# Patient Record
Sex: Male | Born: 1970 | Race: White | Hispanic: No | Marital: Single | Smoking: Never smoker
Health system: Southern US, Community
[De-identification: ages and names within clinical notes are randomized; demographics above are authoritative.]

## PROBLEM LIST (undated history)

## (undated) DIAGNOSIS — F32A Depression, unspecified: Secondary | ICD-10-CM

## (undated) DIAGNOSIS — I10 Essential (primary) hypertension: Secondary | ICD-10-CM

## (undated) DIAGNOSIS — F329 Major depressive disorder, single episode, unspecified: Secondary | ICD-10-CM

## (undated) HISTORY — PX: KNEE SURGERY: SHX244

## (undated) HISTORY — DX: Depression, unspecified: F32.A

## (undated) HISTORY — DX: Major depressive disorder, single episode, unspecified: F32.9

## (undated) HISTORY — DX: Essential (primary) hypertension: I10

## (undated) HISTORY — PX: CHOLECYSTECTOMY: SHX55

---

## 2007-01-28 ENCOUNTER — Encounter: Admission: RE | Admit: 2007-01-28 | Discharge: 2007-01-28 | Payer: Self-pay | Admitting: Internal Medicine

## 2012-03-17 ENCOUNTER — Ambulatory Visit (INDEPENDENT_AMBULATORY_CARE_PROVIDER_SITE_OTHER): Payer: BC Managed Care – PPO | Admitting: Emergency Medicine

## 2012-03-17 VITALS — BP 162/120 | HR 87 | Temp 98.4°F | Resp 16 | Ht 69.0 in | Wt 258.0 lb

## 2012-03-17 DIAGNOSIS — J209 Acute bronchitis, unspecified: Secondary | ICD-10-CM

## 2012-03-17 DIAGNOSIS — J018 Other acute sinusitis: Secondary | ICD-10-CM

## 2012-03-17 DIAGNOSIS — H1030 Unspecified acute conjunctivitis, unspecified eye: Secondary | ICD-10-CM

## 2012-03-17 MED ORDER — SULFACETAMIDE SODIUM 10 % OP SOLN: 1.0000 [drp] | OPHTHALMIC | Status: DC

## 2012-03-17 MED ORDER — HYDROCOD POLST-CHLORPHEN POLST 10-8 MG/5ML PO LQCR: 5.0000 mL | Freq: Two times a day (BID) | ORAL | Status: DC | PRN

## 2012-03-17 MED ORDER — PSEUDOEPHEDRINE-GUAIFENESIN ER 60-600 MG PO TB12: 1.0000 | ORAL_TABLET | Freq: Two times a day (BID) | ORAL | Status: AC

## 2012-03-17 MED ORDER — AMOXICILLIN-POT CLAVULANATE 875-125 MG PO TABS: 1.0000 | ORAL_TABLET | Freq: Two times a day (BID) | ORAL | Status: DC

## 2012-03-17 NOTE — Progress Notes (Signed)
[facility] [address], [place] [address] [phone]  Date:  03/17/2012   Name:  Joshua Friedman   DOB:  06/27/1970   MRN:  [id number]  PCP:  No primary provider on file.    Chief Complaint: URI   History of Present Illness:  Joshua Friedman is a 41 y.o. very pleasant male patient who presents with the following:  Developed green-brown post nasal drainage Sunday and cough productive purulent sputum.  Worse in AM.  Had a fever but now resolved. No wheezing or shortness of breath. No nausea or vomiting.  Has a headache and malaise and myalgias.  No improvement with OTC meds.  Has a headache in frontal area.  There is no problem list on file for this patient.   Past Medical History  Diagnosis Date  . Depression   . Hypertension     Past Surgical History  Procedure Date  . Cholecystectomy   . Knee surgery     History  Substance Use Topics  . Smoking status: Never Smoker   . Smokeless tobacco: Not on file  . Alcohol Use: No    Family History  Problem Relation Age of Onset  . Breast cancer Mother   . Hypertension Father   . Prostate cancer Father     No Known Allergies  Medication list has been reviewed and updated.  No current outpatient prescriptions on file prior to visit.    Review of Systems:  As per HPI, otherwise negative.   Physical Examination: Filed Vitals:   03/17/12 1650  BP: 162/120  Pulse:   Temp:   Resp:    Filed Vitals:   03/17/12 1649  Height: 5\' 9"  (1.753 m)  Weight: 258 lb (117.028 kg)   Body mass index is 38.10 kg/(m^2). Ideal Body Weight: Weight in (lb) to have BMI = 25: 168.9    GEN: WDWN, NAD, Non-toxic, A & O x 3  No rash or sepsis or shortness of breath. HEENT: Atraumatic, Normocephalic. Neck supple. No masses, No LAD.  Oropharynx negative.  Bilateral conjunctival injection and purulent drainage worse on right. Ears and Nose: No external deformity.  Purulent nasal and postnasal drainage CV:  RRR, No M/G/R. No JVD. No thrill. No extra heart sounds. PULM: CTA B, no wheezes, crackles, rhonchi. No retractions. No resp. distress. No accessory muscle use. ABD: S, NT, ND, +BS. No rebound. No HSM. EXTR: No c/c/e NEURO Normal gait.  PSYCH: Normally interactive. Conversant. Not depressed or anxious appearing.  Calm demeanor.    Assessment and Plan: Sinusitis Bronchitis Acute purulent conjunctivitis augmentin mucinex d tussionex Sulfacetamide Follow up as needed  Carmelina Dane, MD

## 2012-03-21 NOTE — Progress Notes (Deleted)
  Subjective:    Patient ID: Joshua Friedman, male    DOB: [birth year], 41 y.o.   MRN: [id number]  HPI    Review of Systems     Objective:   Physical Exam        Assessment & Plan:

## 2012-03-21 NOTE — Progress Notes (Signed)
Reviewed and agree.

## 2015-01-30 ENCOUNTER — Ambulatory Visit: Payer: Self-pay | Admitting: Physician Assistant

## 2015-02-04 ENCOUNTER — Ambulatory Visit (INDEPENDENT_AMBULATORY_CARE_PROVIDER_SITE_OTHER): Payer: BLUE CROSS/BLUE SHIELD | Admitting: Urgent Care

## 2015-02-04 ENCOUNTER — Encounter: Payer: Self-pay | Admitting: Urgent Care

## 2015-02-04 VITALS — BP 196/125 | HR 86 | Temp 98.2°F | Resp 16 | Ht 69.6 in | Wt 298.6 lb

## 2015-02-04 DIAGNOSIS — R030 Elevated blood-pressure reading, without diagnosis of hypertension: Secondary | ICD-10-CM | POA: Diagnosis not present

## 2015-02-04 DIAGNOSIS — IMO0001 Reserved for inherently not codable concepts without codable children: Secondary | ICD-10-CM

## 2015-02-04 DIAGNOSIS — I160 Hypertensive urgency: Secondary | ICD-10-CM

## 2015-02-04 DIAGNOSIS — I10 Essential (primary) hypertension: Secondary | ICD-10-CM

## 2015-02-04 DIAGNOSIS — Z833 Family history of diabetes mellitus: Secondary | ICD-10-CM | POA: Diagnosis not present

## 2015-02-04 DIAGNOSIS — Z6841 Body Mass Index (BMI) 40.0 and over, adult: Secondary | ICD-10-CM | POA: Diagnosis not present

## 2015-02-04 LAB — COMPREHENSIVE METABOLIC PANEL
ALT: 32 U/L (ref 9–46)
AST: 19 U/L (ref 10–40)
Albumin: 4.3 g/dL (ref 3.6–5.1)
Alkaline Phosphatase: 77 U/L (ref 40–115)
BUN: 12 mg/dL (ref 7–25)
CO2: 26 mmol/L (ref 20–31)
Calcium: 9.3 mg/dL (ref 8.6–10.3)
Chloride: 102 mmol/L (ref 98–110)
Creat: 0.74 mg/dL (ref 0.60–1.35)
Glucose, Bld: 80 mg/dL (ref 65–99)
Potassium: 3.8 mmol/L (ref 3.5–5.3)
Sodium: 139 mmol/L (ref 135–146)
Total Bilirubin: 0.6 mg/dL (ref 0.2–1.2)
Total Protein: 7.5 g/dL (ref 6.1–8.1)

## 2015-02-04 LAB — CBC
HCT: 46.4 % (ref 39.0–52.0)
Hemoglobin: 15.8 g/dL (ref 13.0–17.0)
MCH: 30.1 pg (ref 26.0–34.0)
MCHC: 34.1 g/dL (ref 30.0–36.0)
MCV: 88.4 fL (ref 78.0–100.0)
MPV: 9.2 fL (ref 8.6–12.4)
Platelets: 328 10*3/uL (ref 150–400)
RBC: 5.25 MIL/uL (ref 4.22–5.81)
RDW: 14 % (ref 11.5–15.5)
WBC: 8.7 10*3/uL (ref 4.0–10.5)

## 2015-02-04 LAB — LIPID PANEL
Cholesterol: 164 mg/dL (ref 125–200)
HDL: 45 mg/dL (ref >=40)
LDL Cholesterol: 88 mg/dL (ref <130)
Total CHOL/HDL Ratio: 3.6 Ratio (ref <=5.0)
Triglycerides: 154 mg/dL — ABNORMAL HIGH (ref <150)
VLDL: 31 mg/dL — ABNORMAL HIGH (ref <30)

## 2015-02-04 LAB — TROPONIN I: Troponin I: <0.01 ng/mL (ref <0.06)

## 2015-02-04 MED ORDER — LISINOPRIL-HYDROCHLOROTHIAZIDE 20-25 MG PO TABS: 1.0000 | ORAL_TABLET | Freq: Every day | ORAL | Status: AC

## 2015-02-04 NOTE — Patient Instructions (Addendum)
Please start taking  aspirin.   Hypertension Hypertension, commonly called high blood pressure, is when the force of blood pumping through your arteries is too strong. Your arteries are the blood vessels that carry blood from your heart throughout your body. A blood pressure reading consists of a higher number over a lower number, such as 110/72. The higher number (systolic) is the pressure inside your arteries when your heart pumps. The lower number (diastolic) is the pressure inside your arteries when your heart relaxes. Ideally you want your blood pressure below 120/80. Hypertension forces your heart to work harder to pump blood. Your arteries may become narrow or stiff. Having untreated or uncontrolled hypertension can cause heart attack, stroke, kidney disease, and other problems. RISK FACTORS Some risk factors for high blood pressure are controllable. Others are not.  Risk factors you cannot control include:   Race. You may be at higher risk if you are African American.  Age. Risk increases with age.  Gender. Men are at higher risk than women before age 41 years. After age 67, women are at higher risk than men. Risk factors you can control include:  Not getting enough exercise or physical activity.  Being overweight.  Getting too much fat, sugar, calories, or salt in your diet.  Drinking too much alcohol. SIGNS AND SYMPTOMS Hypertension does not usually cause signs or symptoms. Extremely high blood pressure (hypertensive crisis) may cause headache, anxiety, shortness of breath, and nosebleed. DIAGNOSIS To check if you have hypertension, your health care provider will measure your blood pressure while you are seated, with your arm held at the level of your heart. It should be measured at least twice using the same arm. Certain conditions can cause a difference in blood pressure between your right and left arms. A blood pressure reading that is higher than normal on one occasion  does not mean that you need treatment. If it is not clear whether you have high blood pressure, you may be asked to return on a different day to have your blood pressure checked again. Or, you may be asked to monitor your blood pressure at home for 1 or more weeks. TREATMENT Treating high blood pressure includes making lifestyle changes and possibly taking medicine. Living a healthy lifestyle can help lower high blood pressure. You may need to change some of your habits. Lifestyle changes may include:  Following the DASH diet. This diet is high in fruits, vegetables, and whole grains. It is low in salt, red meat, and added sugars.  Keep your sodium intake below 2,300 mg per day.  Getting at least 30-45 minutes of aerobic exercise at least 4 times per week.  Losing weight if necessary.  Not smoking.  Limiting alcoholic beverages.  Learning ways to reduce stress. Your health care provider may prescribe medicine if lifestyle changes are not enough to get your blood pressure under control, and if one of the following is true:  You are 85-61 years of age and your systolic blood pressure is above 140.  You are 17 years of age or older, and your systolic blood pressure is above 150.  Your diastolic blood pressure is above 90.  You have diabetes, and your systolic blood pressure is over 140 or your diastolic blood pressure is over 90.  You have kidney disease and your blood pressure is above 140/90.  You have heart disease and your blood pressure is above 140/90. Your personal target blood pressure may vary depending on your medical conditions, your  age, and other factors. HOME CARE INSTRUCTIONS  Have your blood pressure rechecked as directed by your health care provider.   Take medicines only as directed by your health care provider. Follow the directions carefully. Blood pressure medicines must be taken as prescribed. The medicine does not work as well when you skip doses. Skipping  doses also puts you at risk for problems.  Do not smoke.   Monitor your blood pressure at home as directed by your health care provider. SEEK MEDICAL CARE IF:   You think you are having a reaction to medicines taken.  You have recurrent headaches or feel dizzy.  You have swelling in your ankles.  You have trouble with your vision. SEEK IMMEDIATE MEDICAL CARE IF:  You develop a severe headache or confusion.  You have unusual weakness, numbness, or feel faint.  You have severe chest or abdominal pain.  You vomit repeatedly.  You have trouble breathing. MAKE SURE YOU:   Understand these instructions.  Will watch your condition.  Will get help right away if you are not doing well or get worse.   This information is not intended to replace advice given to you by your health care provider. Make sure you discuss any questions you have with your health care provider.   Document Released: 03/30/2005 Document Revised: 08/14/2014 Document Reviewed: 01/20/2013 Elsevier Interactive Patient Education [employer]2016 Elsevier Inc.

## 2015-02-04 NOTE — Progress Notes (Signed)
    MRN: [id number] DOB: 05/23/1970  Subjective:   Sinclair GroomsBrian Heaslip is a 44 y.o. male with pmh of HTN presenting for chief complaint of HTN.  Reports longstanding history of HTN, previously managed with HCT-lis. Patient lost his insurance ~6-7 years ago and he had since stopped taking his BP meds. He does check his BP at home still, has been ranging from 140's-160's systolic and 100-120 diastolic. He has family history positive for diabetes with his father. No history of heart disease. Denies lightheadedness, dizziness, chronic headache, double vision, chest pain, shortness of breath, heart racing, palpitations, nausea, vomiting, abdominal pain, hematuria, lower leg swelling. Denies smoking cigarettes or drinking alcohol. Denies any other aggravating or relieving factors, no other questions or concerns.  Arlys JohnBrian has a current medication list which includes the following prescription(s): multivitamin. Also has No Known Allergies.  Arlys JohnBrian  has a past medical history of Depression and Hypertension. Also  has past surgical history that includes Cholecystectomy and Knee surgery.  Objective:   Vitals: BP 196/125 mmHg  Pulse 86  Temp(Src) 98.2 F (36.8 C) (Oral)  Resp 16  Ht 5' 9.6" (1.768 m)  Wt 298 lb 9.6 oz (135.444 kg)  BMI 43.33 kg/m2  Physical Exam  Constitutional: He is oriented to person, place, and time. He appears well-developed and well-nourished.  HENT:  Mouth/Throat: Oropharynx is clear and moist.  Eyes: Pupils are equal, round, and reactive to light. No scleral icterus.  Neck: Normal range of motion. Neck supple. No thyromegaly present.  Cardiovascular: Normal rate, regular rhythm and intact distal pulses.  Exam reveals no gallop and no friction rub.   No murmur heard. Pulmonary/Chest: No respiratory distress. He has no wheezes. He has no rales.  Abdominal: Soft. Bowel sounds are normal. He exhibits no distension and no mass. There is no tenderness.  Musculoskeletal: He exhibits  no edema.  Neurological: He is alert and oriented to person, place, and time.  Skin: Skin is warm and dry. No rash noted. No erythema. No pallor.  Psychiatric: He has a normal mood and affect.   ECG interpretation by Dr. Patsy Lageropland and PA-Varina Hulon - possible atrial enlargement, no signs of acute process.  Assessment and Plan :   1. Essential hypertension 2. Hypertensive urgency 3. Elevated blood pressure - Patient declined transport to ED. I had a discussion about possibility of organ damage and ACS in the setting of uncontrolled HTN. Patient opted to manage his current status as an outpatient. Physical exam findings and lack of symptoms reassuring but I counseled patient on symptoms of ACS. Patient is to call 911 if these symptoms arise. - He is to recheck at walk-in-clinic tomorrow.  4. Morbid obesity with BMI of 40.0-44.9, adult (HCC) 5. Family history of diabetes mellitus - Recommended significant dietary changes and start exercising - HgB A1c pending   Wallis BambergMario Kailena Lubas, PA-C [facility] [phone] 02/04/2015 4:28 PM

## 2015-02-05 ENCOUNTER — Ambulatory Visit (INDEPENDENT_AMBULATORY_CARE_PROVIDER_SITE_OTHER): Payer: BLUE CROSS/BLUE SHIELD | Admitting: Urgent Care

## 2015-02-05 VITALS — BP 148/80 | HR 86 | Temp 98.1°F | Resp 16 | Ht 69.0 in | Wt 298.0 lb

## 2015-02-05 DIAGNOSIS — Z833 Family history of diabetes mellitus: Secondary | ICD-10-CM

## 2015-02-05 DIAGNOSIS — I10 Essential (primary) hypertension: Secondary | ICD-10-CM

## 2015-02-05 DIAGNOSIS — Z6841 Body Mass Index (BMI) 40.0 and over, adult: Secondary | ICD-10-CM

## 2015-02-05 NOTE — Patient Instructions (Signed)
Hypertension Hypertension, commonly called high blood pressure, is when the force of blood pumping through your arteries is too strong. Your arteries are the blood vessels that carry blood from your heart throughout your body. A blood pressure reading consists of a higher number over a lower number, such as 110/72. The higher number (systolic) is the pressure inside your arteries when your heart pumps. The lower number (diastolic) is the pressure inside your arteries when your heart relaxes. Ideally you want your blood pressure below 120/80. Hypertension forces your heart to work harder to pump blood. Your arteries may become narrow or stiff. Having untreated or uncontrolled hypertension can cause heart attack, stroke, kidney disease, and other problems. RISK FACTORS Some risk factors for high blood pressure are controllable. Others are not.  Risk factors you cannot control include:   Race. You may be at higher risk if you are African American.  Age. Risk increases with age.  Gender. Men are at higher risk than women before age 45 years. After age 65, women are at higher risk than men. Risk factors you can control include:  Not getting enough exercise or physical activity.  Being overweight.  Getting too much fat, sugar, calories, or salt in your diet.  Drinking too much alcohol. SIGNS AND SYMPTOMS Hypertension does not usually cause signs or symptoms. Extremely high blood pressure (hypertensive crisis) may cause headache, anxiety, shortness of breath, and nosebleed. DIAGNOSIS To check if you have hypertension, your health care provider will measure your blood pressure while you are seated, with your arm held at the level of your heart. It should be measured at least twice using the same arm. Certain conditions can cause a difference in blood pressure between your right and left arms. A blood pressure reading that is higher than normal on one occasion does not mean that you need treatment. If  it is not clear whether you have high blood pressure, you may be asked to return on a different day to have your blood pressure checked again. Or, you may be asked to monitor your blood pressure at home for 1 or more weeks. TREATMENT Treating high blood pressure includes making lifestyle changes and possibly taking medicine. Living a healthy lifestyle can help lower high blood pressure. You may need to change some of your habits. Lifestyle changes may include:  Following the DASH diet. This diet is high in fruits, vegetables, and whole grains. It is low in salt, red meat, and added sugars.  Keep your sodium intake below 2,300 mg per day.  Getting at least 30-45 minutes of aerobic exercise at least 4 times per week.  Losing weight if necessary.  Not smoking.  Limiting alcoholic beverages.  Learning ways to reduce stress. Your health care provider may prescribe medicine if lifestyle changes are not enough to get your blood pressure under control, and if one of the following is true:  You are 18-59 years of age and your systolic blood pressure is above 140.  You are 60 years of age or older, and your systolic blood pressure is above 150.  Your diastolic blood pressure is above 90.  You have diabetes, and your systolic blood pressure is over 140 or your diastolic blood pressure is over 90.  You have kidney disease and your blood pressure is above 140/90.  You have heart disease and your blood pressure is above 140/90. Your personal target blood pressure may vary depending on your medical conditions, your age, and other factors. HOME CARE INSTRUCTIONS    Have your blood pressure rechecked as directed by your health care provider.   Take medicines only as directed by your health care provider. Follow the directions carefully. Blood pressure medicines must be taken as prescribed. The medicine does not work as well when you skip doses. Skipping doses also puts you at risk for  problems.  Do not smoke.   Monitor your blood pressure at home as directed by your health care provider. SEEK MEDICAL CARE IF:   You think you are having a reaction to medicines taken.  You have recurrent headaches or feel dizzy.  You have swelling in your ankles.  You have trouble with your vision. SEEK IMMEDIATE MEDICAL CARE IF:  You develop a severe headache or confusion.  You have unusual weakness, numbness, or feel faint.  You have severe chest or abdominal pain.  You vomit repeatedly.  You have trouble breathing. MAKE SURE YOU:   Understand these instructions.  Will watch your condition.  Will get help right away if you are not doing well or get worse.   This information is not intended to replace advice given to you by your health care provider. Make sure you discuss any questions you have with your health care provider.   Document Released: 03/30/2005 Document Revised: 08/14/2014 Document Reviewed: 01/20/2013 Elsevier Interactive Patient Education 2016 Elsevier Inc. Managing Your High Blood Pressure Blood pressure is a measurement of how forceful your blood is pressing against the walls of the arteries. Arteries are muscular tubes within the circulatory system. Blood pressure does not stay the same. Blood pressure rises when you are active, excited, or nervous; and it lowers during sleep and relaxation. If the numbers measuring your blood pressure stay above normal most of the time, you are at risk for health problems. High blood pressure (hypertension) is a long-term (chronic) condition in which blood pressure is elevated. A blood pressure reading is recorded as two numbers, such as 120 over 80 (or 120/80). The first, higher number is called the systolic pressure. It is a measure of the pressure in your arteries as the heart beats. The second, lower number is called the diastolic pressure. It is a measure of the pressure in your arteries as the heart relaxes between  beats.  Keeping your blood pressure in a normal range is important to your overall health and prevention of health problems, such as heart disease and stroke. When your blood pressure is uncontrolled, your heart has to work harder than normal. High blood pressure is a very common condition in adults because blood pressure tends to rise with age. Men and women are equally likely to have hypertension but at different times in life. Before age 45, men are more likely to have hypertension. After 46 years of age, women are more likely to have it. Hypertension is especially common in African Americans. This condition often has no signs or symptoms. The cause of the condition is usually not known. Your caregiver can help you come up with a plan to keep your blood pressure in a normal, healthy range. BLOOD PRESSURE STAGES Blood pressure is classified into four stages: normal, prehypertension, stage 1, and stage 2. Your blood pressure reading will be used to determine what type of treatment, if any, is necessary. Appropriate treatment options are tied to these four stages:  Normal  Systolic pressure (mm Hg): below 120.  Diastolic pressure (mm Hg): below 80. Prehypertension  Systolic pressure (mm Hg): 120 to 139.  Diastolic pressure (mm Hg): 80 to   89. Stage1  Systolic pressure (mm Hg): 140 to 159.  Diastolic pressure (mm Hg): 90 to 99. Stage2  Systolic pressure (mm Hg): 160 or above.  Diastolic pressure (mm Hg): 100 or above. RISKS RELATED TO HIGH BLOOD PRESSURE Managing your blood pressure is an important responsibility. Uncontrolled high blood pressure can lead to:  A heart attack.  A stroke.  A weakened blood vessel (aneurysm).  Heart failure.  Kidney damage.  Eye damage.  Metabolic syndrome.  Memory and concentration problems. HOW TO MANAGE YOUR BLOOD PRESSURE Blood pressure can be managed effectively with lifestyle changes and medicines (if needed). Your caregiver will help  you come up with a plan to bring your blood pressure within a normal range. Your plan should include the following: Education  Read all information provided by your caregivers about how to control blood pressure.  Educate yourself on the latest guidelines and treatment recommendations. New research is always being done to further define the risks and treatments for high blood pressure. Lifestylechanges  Control your weight.  Avoid smoking.  Stay physically active.  Reduce the amount of salt in your diet.  Reduce stress.  Control any chronic conditions, such as high cholesterol or diabetes.  Reduce your alcohol intake. Medicines  Several medicines (antihypertensive medicines) are available, if needed, to bring blood pressure within a normal range. Communication  Review all the medicines you take with your caregiver because there may be side effects or interactions.  Talk with your caregiver about your diet, exercise habits, and other lifestyle factors that may be contributing to high blood pressure.  See your caregiver regularly. Your caregiver can help you create and adjust your plan for managing high blood pressure. RECOMMENDATIONS FOR TREATMENT AND FOLLOW-UP  The following recommendations are based on current guidelines for managing high blood pressure in nonpregnant adults. Use these recommendations to identify the proper follow-up period or treatment option based on your blood pressure reading. You can discuss these options with your caregiver.  Systolic pressure of 120 to 139 or diastolic pressure of 80 to 89: Follow up with your caregiver as directed.  Systolic pressure of 140 to 160 or diastolic pressure of 90 to 100: Follow up with your caregiver within 2 months.  Systolic pressure above 160 or diastolic pressure above 100: Follow up with your caregiver within 1 month.  Systolic pressure above 180 or diastolic pressure above 110: Consider antihypertensive therapy;  follow up with your caregiver within 1 week.  Systolic pressure above 200 or diastolic pressure above 120: Begin antihypertensive therapy; follow up with your caregiver within 1 week.   This information is not intended to replace advice given to you by your health care provider. Make sure you discuss any questions you have with your health care provider.   Document Released: 12/23/2011 Document Reviewed: 12/23/2011 Elsevier Interactive Patient Education 2016 Elsevier Inc.  

## 2015-02-05 NOTE — Progress Notes (Signed)
MRN: [id number] DOB: 06/25/1970  Subjective:   Joshua GroomsBrian Friedman is a 44 y.o. male presenting for chief complaint of Follow-up  Patient was seen yesterday for HTN urgency. He was started on 1/2 tablet of HCT-lis and has taken this twice since yesterday. Admits slight dull headache, has not tried anything for this. Denies lightheadedness, dizziness, severe headache, double vision, chest pain, shortness of breath, heart racing, palpitations, nausea, vomiting, abdominal pain, hematuria, lower leg swelling. Denies any other aggravating or relieving factors, no other questions or concerns.  Joshua Friedman has a current medication list which includes the following prescription(s): lisinopril-hydrochlorothiazide and multivitamin. Also has No Known Allergies.  Joshua Friedman  has a past medical history of Depression and Hypertension. Also  has past surgical history that includes Cholecystectomy and Knee surgery.  Objective:   Vitals: BP 148/80 mmHg  Pulse 86  Temp(Src) 98.1 F (36.7 C) (Oral)  Resp 16  Ht 5\' 9"  (1.753 m)  Wt 298 lb (135.172 kg)  BMI 43.99 kg/m2  SpO2 98%  BP Readings from Last 3 Encounters:  02/05/15 148/80  02/04/15 196/125  03/17/12 162/120   Physical Exam  Constitutional: He is oriented to person, place, and time. He appears well-developed and well-nourished.  HENT:  Mouth/Throat: Oropharynx is clear and moist.  Eyes: Pupils are equal, round, and reactive to light. No scleral icterus.  Cardiovascular: Normal rate, regular rhythm and intact distal pulses.  Exam reveals no gallop and no friction rub.   No murmur heard. No carotid bruits.  Pulmonary/Chest: No respiratory distress. He has no wheezes. He has no rales.  Musculoskeletal: He exhibits no edema.  Neurological: He is alert and oriented to person, place, and time.  Skin: Skin is warm and dry. No rash noted. No erythema. No pallor.    Results for orders placed or performed in visit on 02/04/15 (from the past 24 hour(s))    Comprehensive metabolic panel     Status: None   Collection Time: 02/04/15  4:36 PM  Result Value Ref Range   Sodium 139 135 - 146 mmol/L   Potassium 3.8 3.5 - 5.3 mmol/L   Chloride 102 98 - 110 mmol/L   CO2 26 20 - 31 mmol/L   Glucose, Bld 80 65 - 99 mg/dL   BUN 12 7 - 25 mg/dL   Creat 1.190.74 1.470.60 - 8.291.35 mg/dL   Total Bilirubin 0.6 0.2 - 1.2 mg/dL   Alkaline Phosphatase 77 40 - 115 U/L   AST 19 10 - 40 U/L   ALT 32 9 - 46 U/L   Total Protein 7.5 6.1 - 8.1 g/dL   Albumin 4.3 3.6 - 5.1 g/dL   Calcium 9.3 8.6 - 56.210.3 mg/dL   Narrative   Performed at:  [facility]                [address], [address]                [place], [place] [id number]  Lipid panel     Status: Abnormal   Collection Time: 02/04/15  4:36 PM  Result Value Ref Range   Cholesterol 164 125 - 200 mg/dL   Triglycerides 469154 (H) <150 mg/dL   HDL 45 >=62>=40 mg/dL   Total CHOL/HDL Ratio 3.6 <=5.0 Ratio   VLDL 31 (H) <30 mg/dL   LDL Cholesterol 88 <952<130 mg/dL   Narrative   Performed at:  [facility]  [address], [address]                [place], [place] [address]  CBC     Status: None   Collection Time: 02/04/15  4:36 PM  Result Value Ref Range   WBC 8.7 4.0 - 10.5 K/uL   RBC 5.25 4.22 - 5.81 MIL/uL   Hemoglobin 15.8 13.0 - 17.0 g/dL   HCT 54.0 98.1 - 19.1 %   MCV 88.4 78.0 - 100.0 fL   MCH 30.1 26.0 - 34.0 pg   MCHC 34.1 30.0 - 36.0 g/dL   RDW 47.8 29.5 - 62.1 %   Platelets 328 150 - 400 K/uL   MPV 9.2 8.6 - 12.4 fL   Narrative   Performed at:  [facility]                [address], [address]                [place], [place] [address]  Troponin I     Status: None   Collection Time: 02/04/15  4:48 PM  Result Value Ref Range   Troponin I <0.01 <0.06 ng/mL   Narrative   Performed at:  [facility]                [address], [address]                [place], [place] [address]    Assessment and Plan :   1. Essential hypertension - Significantly  improved, switch to 1 tablet of HCT-Lis daily. Continue aspirin for now. Counseled patient on worrisome symptoms of ACS. Patient is to check BP 2 times weekly. Report BP at 4 week visit unless persistently above 160, start 3rd BP medication.  2. Morbid obesity with BMI of 40.0-44.9, adult (HCC) 3. Family history of diabetes mellitus - Recommended significant dietary modifications and exercise.  Wallis Bamberg, PA-C [facility] [phone] 02/05/2015 12:43 PM

## 2015-02-07 ENCOUNTER — Ambulatory Visit: Payer: Self-pay | Admitting: Urgent Care

## 2015-02-13 ENCOUNTER — Ambulatory Visit: Payer: Self-pay | Admitting: Urgent Care

## 2015-03-05 ENCOUNTER — Ambulatory Visit: Payer: BLUE CROSS/BLUE SHIELD | Admitting: Urgent Care

## 2016-01-04 ENCOUNTER — Other Ambulatory Visit: Payer: Self-pay | Admitting: Family Medicine

## 2016-01-04 DIAGNOSIS — M25561 Pain in right knee: Secondary | ICD-10-CM

## 2016-02-01 ENCOUNTER — Ambulatory Visit
Admission: RE | Admit: 2016-02-01 | Discharge: 2016-02-01 | Disposition: A | Discharge status: Home / Self Care | Payer: 59 | Source: Ambulatory Visit | Attending: Family Medicine | Admitting: Family Medicine

## 2016-02-01 DIAGNOSIS — M25561 Pain in right knee: Secondary | ICD-10-CM

## 2017-02-04 DIAGNOSIS — Z23 Encounter for immunization: Secondary | ICD-10-CM | POA: Diagnosis not present

## 2017-04-20 DIAGNOSIS — E7800 Pure hypercholesterolemia, unspecified: Secondary | ICD-10-CM | POA: Diagnosis not present

## 2017-04-20 DIAGNOSIS — I10 Essential (primary) hypertension: Secondary | ICD-10-CM | POA: Diagnosis not present

## 2017-04-20 DIAGNOSIS — G4733 Obstructive sleep apnea (adult) (pediatric): Secondary | ICD-10-CM | POA: Diagnosis not present

## 2017-04-20 DIAGNOSIS — R7309 Other abnormal glucose: Secondary | ICD-10-CM | POA: Diagnosis not present

## 2017-04-20 DIAGNOSIS — Z0000 Encounter for general adult medical examination without abnormal findings: Secondary | ICD-10-CM | POA: Diagnosis not present

## 2017-04-20 DIAGNOSIS — Z23 Encounter for immunization: Secondary | ICD-10-CM | POA: Diagnosis not present

## 2017-04-30 DIAGNOSIS — G4733 Obstructive sleep apnea (adult) (pediatric): Secondary | ICD-10-CM | POA: Diagnosis not present

## 2017-05-17 DIAGNOSIS — G4733 Obstructive sleep apnea (adult) (pediatric): Secondary | ICD-10-CM | POA: Diagnosis not present

## 2017-07-26 DIAGNOSIS — G4733 Obstructive sleep apnea (adult) (pediatric): Secondary | ICD-10-CM | POA: Diagnosis not present

## 2017-08-04 DIAGNOSIS — G4733 Obstructive sleep apnea (adult) (pediatric): Secondary | ICD-10-CM | POA: Diagnosis not present

## 2017-10-18 DIAGNOSIS — I10 Essential (primary) hypertension: Secondary | ICD-10-CM | POA: Diagnosis not present

## 2017-10-18 DIAGNOSIS — G4733 Obstructive sleep apnea (adult) (pediatric): Secondary | ICD-10-CM | POA: Diagnosis not present

## 2017-10-18 DIAGNOSIS — R7303 Prediabetes: Secondary | ICD-10-CM | POA: Diagnosis not present

## 2018-01-02 IMAGING — MR MR KNEE*R* W/O CM
4 of 5 series · 31 of 40 positions shown · non-contrast
Comparison: None.

CLINICAL DATA: Right knee pain over the past 6-12 months. Prior
arthroscopy in 5054.

EXAM:
MRI OF THE RIGHT KNEE WITHOUT CONTRAST
TECHNIQUE: Multiplanar, multisequence MR imaging of the knee was performed. No
intravenous contrast was administered.

[Series 6: PD fat-sat · axial · right · 3.0mm · 0.42mm/px · z∈[+7,+137]mm · 9 of 41 slices shown (1 of 3)]
[im 1/41]
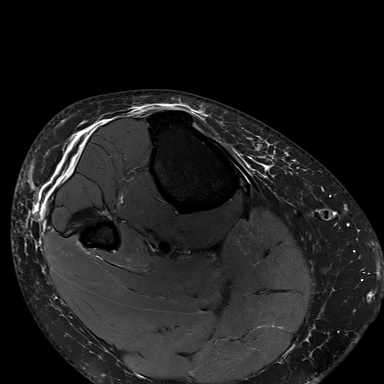
[im 6/41]
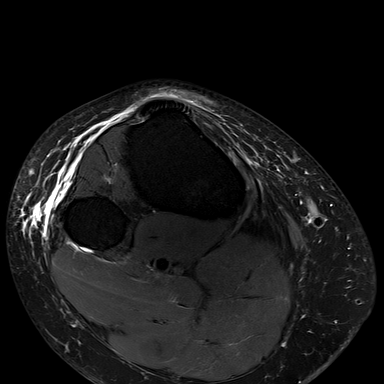
[im 11/41]
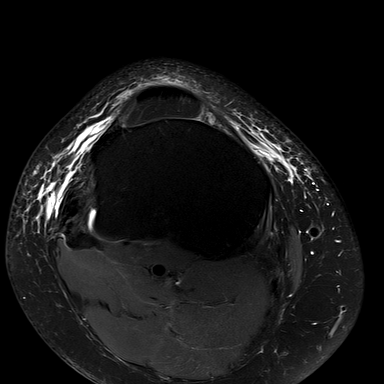
[im 16/41]
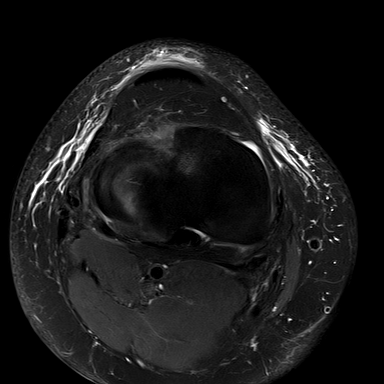
[im 21/41]
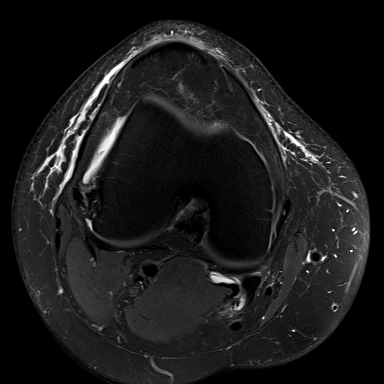
[im 26/41]
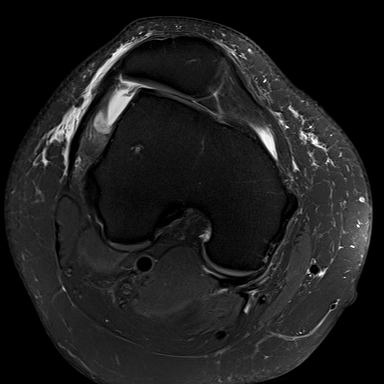
[im 31/41]
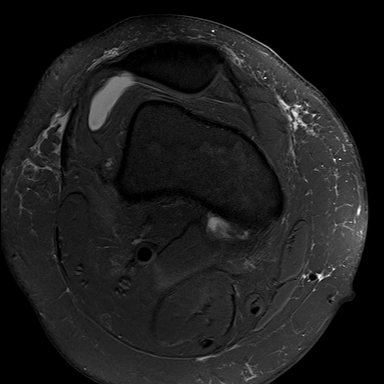
[im 36/41]
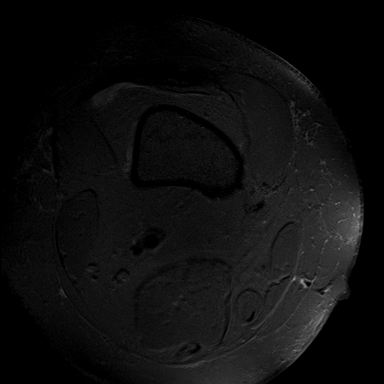
[im 41/41]
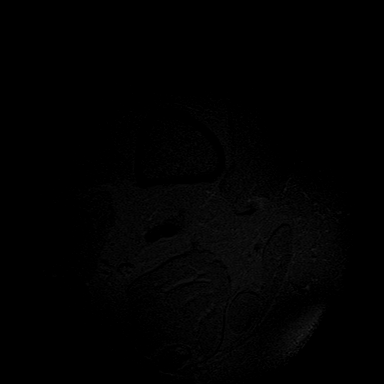

[Series 8: PD fat-sat · coronal · right · 3.0mm · 0.33mm/px · 8 of 37 slices shown (2 of 3)]
[im 1/37]
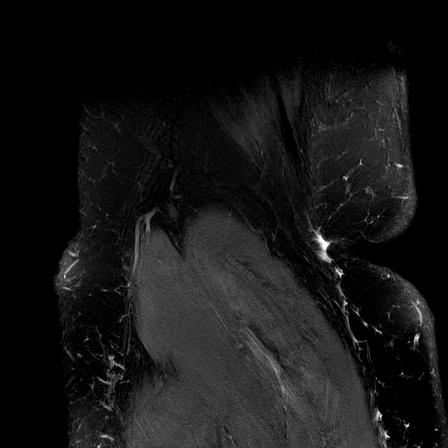
[im 6/37]
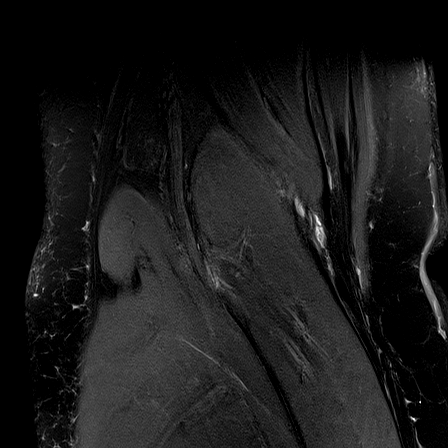
[im 11/37]
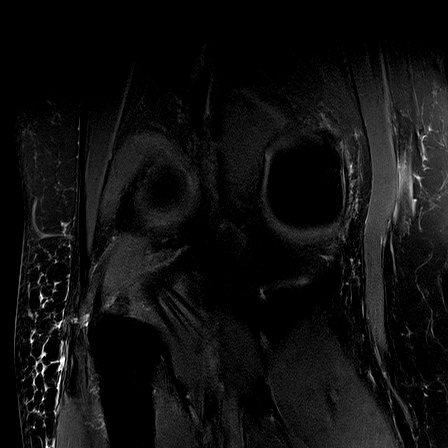
[im 16/37]
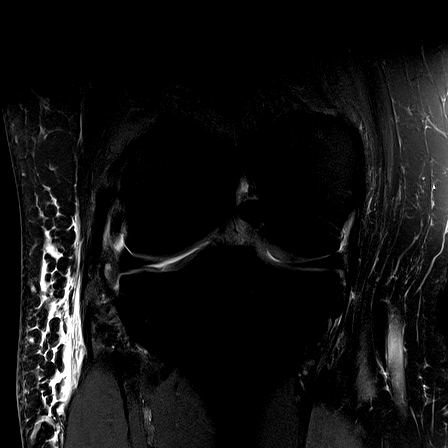
[im 21/37]
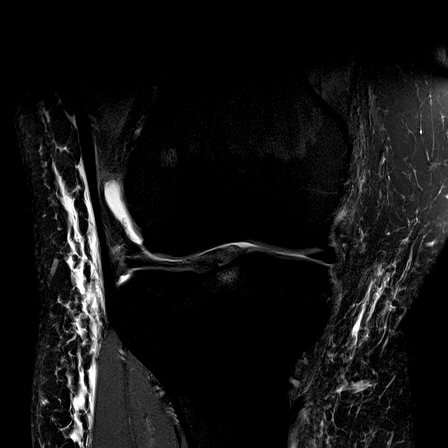
[im 26/37]
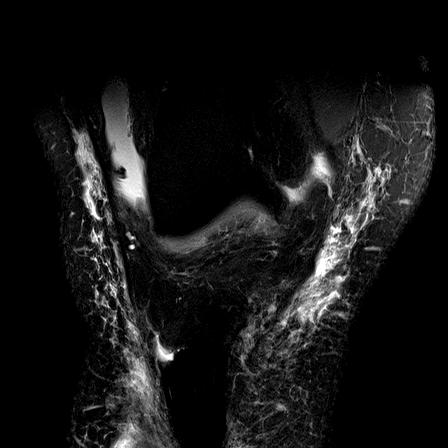
[im 31/37]
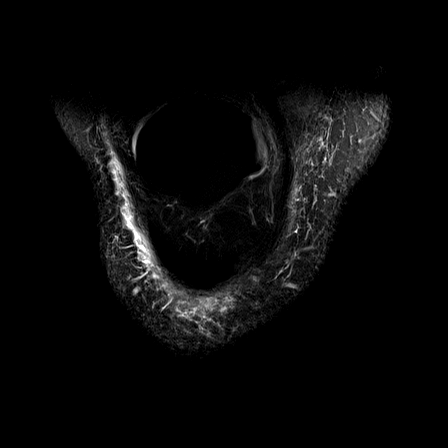
[im 37/37]
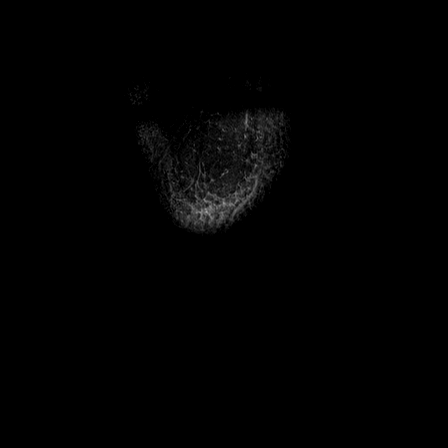

[Series 9: PD fat-sat · sagittal · right · 3.0mm · 0.42mm/px · 7 of 30 slices shown (3 of 3)]
[im 1/30]
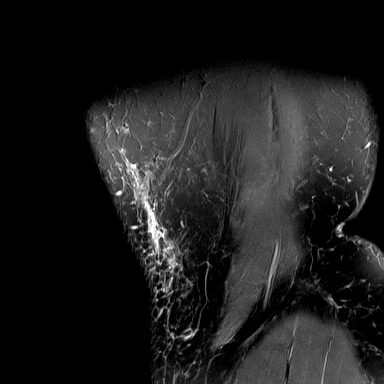
[im 5/30]
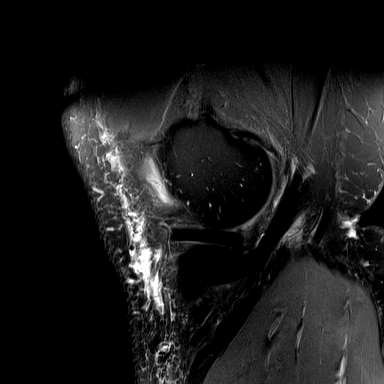
[im 10/30]
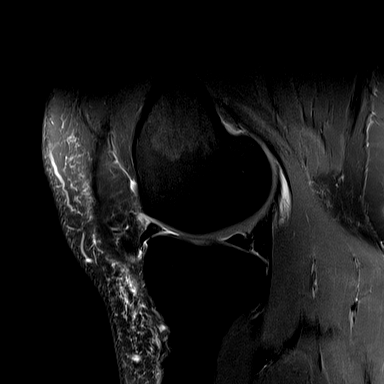
[im 15/30]
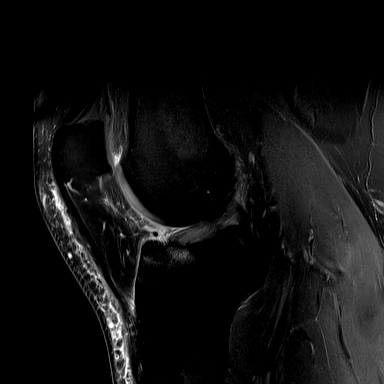
[im 20/30]
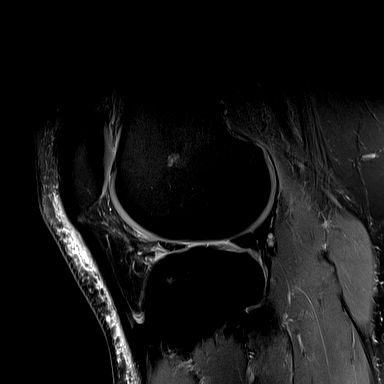
[im 25/30]
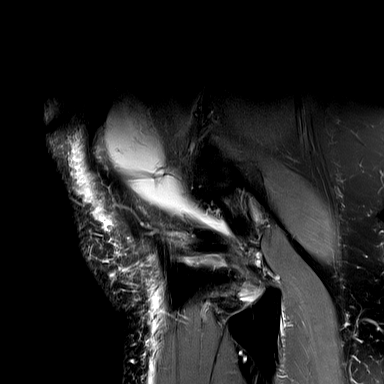
[im 30/30]
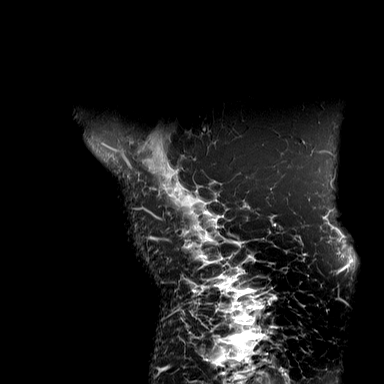

[Series 10: T2 fat-sat · coronal · right · 3.0mm · 0.39mm/px · 7 of 37 slices shown]
[im 1/37]
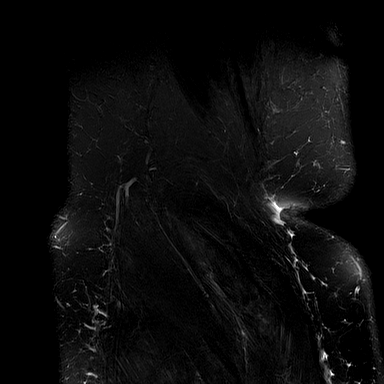
[im 6/37]
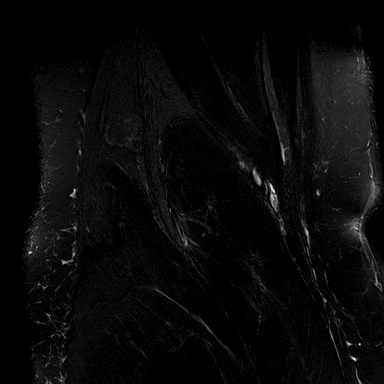
[im 11/37]
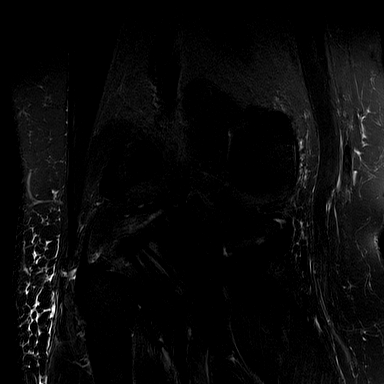
[im 16/37]
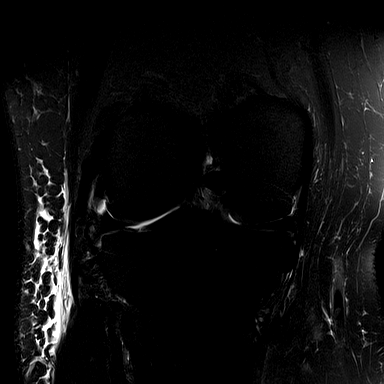
[im 21/37]
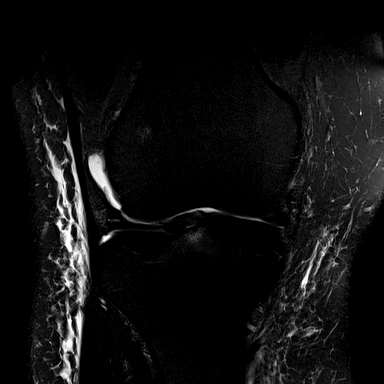
[im 26/37]
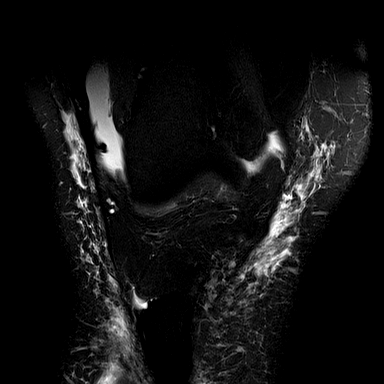
[im 31/37]
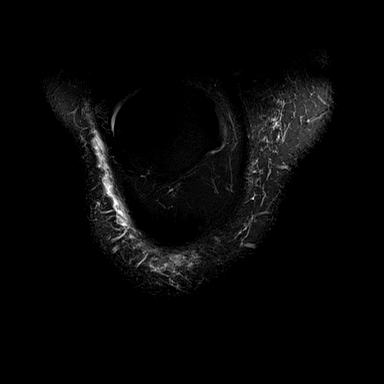

[31 of 40 positions shown; findings below may reference images not displayed]

FINDINGS: MENISCI

Medial meniscus:  Unremarkable

Lateral meniscus: Oblique grade 3 tear of the anterior horn
extending to the superior surface on images 21 through 24 series 9.

LIGAMENTS

Cruciates:  Unremarkable

Collaterals: Borderline thickening of proximal MCL without
surrounding edema.

CARTILAGE

Patellofemoral:  Unremarkable

Medial: Mild to moderate chondral thinning along the medial tibial
plateau.

Lateral: Chondral fissuring and focal chondral thinning along the
central posterior portion of the lateral tibial plateau on images
15-16 series 8. There is also coronal a oriented fissuring in the
central lateral tibial plateau articular cartilage shown on image
[DATE] and image [DATE].

Joint: Small knee effusion. Small amount of edema in Hoffa's fat
pad.

Popliteal Fossa:  Small Baker's cyst.

Extensor Mechanism: Shallow femoral trochlear groove with lateral
subluxation of the patella within the femoral trochlear groove. This
configuration may predispose to maltracking. There is thickening of
the lateral patellar retinaculum. Tibial tubercle -trochlear groove
distance 1.6 cm. Prepatellar subcutaneous edema.

Bones: 9 mm enchondroma laterally in the distal femoral metaphysis,
benign.

Other: No supplemental non-categorized findings.
IMPRESSION: 1. Oblique grade 3 tear of the anterior horn lateral meniscus
involving the superior meniscal surface.
2. Chondral fissuring along the lateral tibial plateau with some
focal chondral thinning. Mild to moderate chondral thinning in the
medial compartment.
3. Small knee effusion with small Baker's cyst.
4. Shallow femoral trochlear groove with some lateral subluxation of
the patella may predispose to maltracking.
5. Borderline thickening of the proximal MCL, potentially from a
remote injury.

## 2018-01-24 DIAGNOSIS — Z23 Encounter for immunization: Secondary | ICD-10-CM | POA: Diagnosis not present

## 2018-02-08 DIAGNOSIS — G4733 Obstructive sleep apnea (adult) (pediatric): Secondary | ICD-10-CM | POA: Diagnosis not present

## 2018-04-26 DIAGNOSIS — E7800 Pure hypercholesterolemia, unspecified: Secondary | ICD-10-CM | POA: Diagnosis not present

## 2018-04-26 DIAGNOSIS — I10 Essential (primary) hypertension: Secondary | ICD-10-CM | POA: Diagnosis not present

## 2018-04-26 DIAGNOSIS — R7303 Prediabetes: Secondary | ICD-10-CM | POA: Diagnosis not present

## 2018-04-26 DIAGNOSIS — G4733 Obstructive sleep apnea (adult) (pediatric): Secondary | ICD-10-CM | POA: Diagnosis not present

## 2018-05-16 DIAGNOSIS — G4733 Obstructive sleep apnea (adult) (pediatric): Secondary | ICD-10-CM | POA: Diagnosis not present

## 2019-03-24 DIAGNOSIS — Z03818 Encounter for observation for suspected exposure to other biological agents ruled out: Secondary | ICD-10-CM | POA: Diagnosis not present

## 2019-03-30 DIAGNOSIS — S30861A Insect bite (nonvenomous) of abdominal wall, initial encounter: Secondary | ICD-10-CM | POA: Diagnosis not present

## 2019-04-25 DIAGNOSIS — I10 Essential (primary) hypertension: Secondary | ICD-10-CM | POA: Diagnosis not present

## 2019-04-25 DIAGNOSIS — R7303 Prediabetes: Secondary | ICD-10-CM | POA: Diagnosis not present

## 2019-04-25 DIAGNOSIS — B353 Tinea pedis: Secondary | ICD-10-CM | POA: Diagnosis not present

## 2019-04-25 DIAGNOSIS — M79604 Pain in right leg: Secondary | ICD-10-CM | POA: Diagnosis not present

## 2019-04-25 DIAGNOSIS — Z125 Encounter for screening for malignant neoplasm of prostate: Secondary | ICD-10-CM | POA: Diagnosis not present

## 2019-04-25 DIAGNOSIS — Z23 Encounter for immunization: Secondary | ICD-10-CM | POA: Diagnosis not present

## 2019-04-25 DIAGNOSIS — Z0000 Encounter for general adult medical examination without abnormal findings: Secondary | ICD-10-CM | POA: Diagnosis not present

## 2019-04-25 DIAGNOSIS — E782 Mixed hyperlipidemia: Secondary | ICD-10-CM | POA: Diagnosis not present

## 2019-05-15 DIAGNOSIS — G4733 Obstructive sleep apnea (adult) (pediatric): Secondary | ICD-10-CM | POA: Diagnosis not present

## 2019-06-16 DIAGNOSIS — Z1211 Encounter for screening for malignant neoplasm of colon: Secondary | ICD-10-CM | POA: Diagnosis not present

## 2019-07-17 DIAGNOSIS — Z1159 Encounter for screening for other viral diseases: Secondary | ICD-10-CM | POA: Diagnosis not present

## 2019-07-20 DIAGNOSIS — K5730 Diverticulosis of large intestine without perforation or abscess without bleeding: Secondary | ICD-10-CM | POA: Diagnosis not present

## 2019-07-20 DIAGNOSIS — Z1211 Encounter for screening for malignant neoplasm of colon: Secondary | ICD-10-CM | POA: Diagnosis not present

## 2019-07-20 DIAGNOSIS — K640 First degree hemorrhoids: Secondary | ICD-10-CM | POA: Diagnosis not present

## 2019-08-25 DIAGNOSIS — F331 Major depressive disorder, recurrent, moderate: Secondary | ICD-10-CM | POA: Diagnosis not present

## 2019-08-25 DIAGNOSIS — G4700 Insomnia, unspecified: Secondary | ICD-10-CM | POA: Diagnosis not present

## 2019-08-25 DIAGNOSIS — I10 Essential (primary) hypertension: Secondary | ICD-10-CM | POA: Diagnosis not present

## 2019-09-13 DIAGNOSIS — F4321 Adjustment disorder with depressed mood: Secondary | ICD-10-CM | POA: Diagnosis not present

## 2019-09-19 DIAGNOSIS — F4321 Adjustment disorder with depressed mood: Secondary | ICD-10-CM | POA: Diagnosis not present

## 2019-10-11 DIAGNOSIS — F4321 Adjustment disorder with depressed mood: Secondary | ICD-10-CM | POA: Diagnosis not present

## 2019-10-23 DIAGNOSIS — F331 Major depressive disorder, recurrent, moderate: Secondary | ICD-10-CM | POA: Diagnosis not present

## 2019-10-23 DIAGNOSIS — I10 Essential (primary) hypertension: Secondary | ICD-10-CM | POA: Diagnosis not present

## 2019-10-23 DIAGNOSIS — R7303 Prediabetes: Secondary | ICD-10-CM | POA: Diagnosis not present

## 2019-10-23 DIAGNOSIS — G4700 Insomnia, unspecified: Secondary | ICD-10-CM | POA: Diagnosis not present

## 2019-10-25 DIAGNOSIS — F4321 Adjustment disorder with depressed mood: Secondary | ICD-10-CM | POA: Diagnosis not present

## 2019-11-09 DIAGNOSIS — F4321 Adjustment disorder with depressed mood: Secondary | ICD-10-CM | POA: Diagnosis not present

## 2019-11-21 DIAGNOSIS — F4321 Adjustment disorder with depressed mood: Secondary | ICD-10-CM | POA: Diagnosis not present

## 2019-12-07 DIAGNOSIS — F4321 Adjustment disorder with depressed mood: Secondary | ICD-10-CM | POA: Diagnosis not present

## 2019-12-20 DIAGNOSIS — F4321 Adjustment disorder with depressed mood: Secondary | ICD-10-CM | POA: Diagnosis not present

## 2020-01-04 DIAGNOSIS — F4321 Adjustment disorder with depressed mood: Secondary | ICD-10-CM | POA: Diagnosis not present

## 2020-01-25 DIAGNOSIS — F4321 Adjustment disorder with depressed mood: Secondary | ICD-10-CM | POA: Diagnosis not present

## 2020-02-08 DIAGNOSIS — F4321 Adjustment disorder with depressed mood: Secondary | ICD-10-CM | POA: Diagnosis not present

## 2020-02-19 DIAGNOSIS — F4321 Adjustment disorder with depressed mood: Secondary | ICD-10-CM | POA: Diagnosis not present

## 2020-03-08 DIAGNOSIS — F4321 Adjustment disorder with depressed mood: Secondary | ICD-10-CM | POA: Diagnosis not present

## 2020-03-28 DIAGNOSIS — F4321 Adjustment disorder with depressed mood: Secondary | ICD-10-CM | POA: Diagnosis not present

## 2020-04-12 DIAGNOSIS — F4321 Adjustment disorder with depressed mood: Secondary | ICD-10-CM | POA: Diagnosis not present

## 2020-04-22 DIAGNOSIS — F4321 Adjustment disorder with depressed mood: Secondary | ICD-10-CM | POA: Diagnosis not present

## 2020-04-25 DIAGNOSIS — F4321 Adjustment disorder with depressed mood: Secondary | ICD-10-CM | POA: Diagnosis not present

## 2020-05-01 DIAGNOSIS — F4321 Adjustment disorder with depressed mood: Secondary | ICD-10-CM | POA: Diagnosis not present

## 2020-05-02 DIAGNOSIS — Z23 Encounter for immunization: Secondary | ICD-10-CM | POA: Diagnosis not present

## 2020-05-02 DIAGNOSIS — E1169 Type 2 diabetes mellitus with other specified complication: Secondary | ICD-10-CM | POA: Diagnosis not present

## 2020-05-02 DIAGNOSIS — F331 Major depressive disorder, recurrent, moderate: Secondary | ICD-10-CM | POA: Diagnosis not present

## 2020-05-02 DIAGNOSIS — G4700 Insomnia, unspecified: Secondary | ICD-10-CM | POA: Diagnosis not present

## 2020-05-02 DIAGNOSIS — L0390 Cellulitis, unspecified: Secondary | ICD-10-CM | POA: Diagnosis not present

## 2020-05-02 DIAGNOSIS — I10 Essential (primary) hypertension: Secondary | ICD-10-CM | POA: Diagnosis not present

## 2020-05-08 DIAGNOSIS — F4321 Adjustment disorder with depressed mood: Secondary | ICD-10-CM | POA: Diagnosis not present

## 2020-05-13 DIAGNOSIS — F4321 Adjustment disorder with depressed mood: Secondary | ICD-10-CM | POA: Diagnosis not present
